# Patient Record
Sex: Female | Born: 1975 | Race: White | Hispanic: No | Marital: Married | Smoking: Never smoker
Health system: Southern US, Community
[De-identification: ages and names within clinical notes are randomized; demographics above are authoritative.]

## PROBLEM LIST (undated history)

## (undated) DIAGNOSIS — M419 Scoliosis, unspecified: Secondary | ICD-10-CM

## (undated) DIAGNOSIS — K219 Gastro-esophageal reflux disease without esophagitis: Secondary | ICD-10-CM

## (undated) HISTORY — PX: ESOPHAGOGASTRODUODENOSCOPY: SHX1529

## (undated) HISTORY — PX: DIAGNOSTIC LAPAROSCOPY: SUR761

---

## 2007-05-07 ENCOUNTER — Ambulatory Visit: Payer: Self-pay | Admitting: Gynecology

## 2007-05-08 ENCOUNTER — Inpatient Hospital Stay (HOSPITAL_COMMUNITY): Admission: AD | Admit: 2007-05-08 | Discharge: 2007-05-08 | Payer: Self-pay | Admitting: Obstetrics & Gynecology

## 2008-03-31 ENCOUNTER — Ambulatory Visit: Payer: Self-pay

## 2008-04-01 ENCOUNTER — Inpatient Hospital Stay: Payer: Self-pay

## 2009-04-15 ENCOUNTER — Ambulatory Visit: Payer: Self-pay | Admitting: Unknown Physician Specialty

## 2009-04-21 ENCOUNTER — Ambulatory Visit: Payer: Self-pay | Admitting: Unknown Physician Specialty

## 2010-11-22 LAB — URINALYSIS, ROUTINE W REFLEX MICROSCOPIC
Bilirubin Urine: NEGATIVE
Glucose, UA: NEGATIVE
Specific Gravity, Urine: <1.005 — ABNORMAL LOW
Urobilinogen, UA: 0.2
pH: 6

## 2010-11-22 LAB — POCT PREGNANCY, URINE: Operator id: 113551

## 2010-11-22 LAB — URINE MICROSCOPIC-ADD ON

## 2010-11-22 LAB — HCG, SERUM, QUALITATIVE: Preg, Serum: NEGATIVE

## 2011-08-20 ENCOUNTER — Emergency Department: Payer: Self-pay | Admitting: Internal Medicine

## 2011-08-20 LAB — URINALYSIS, COMPLETE: Bilirubin,UR: NEGATIVE

## 2011-08-20 LAB — COMPREHENSIVE METABOLIC PANEL
Albumin: 4 g/dL (ref 3.4–5.0)
Anion Gap: 2 — ABNORMAL LOW (ref 7–16)
Bilirubin,Total: 0.4 mg/dL (ref 0.2–1.0)
Calcium, Total: 9.7 mg/dL (ref 8.5–10.1)
Chloride: 103 mmol/L (ref 98–107)
EGFR (Non-African Amer.): >60
Potassium: 4.3 mmol/L (ref 3.5–5.1)
SGPT (ALT): 20 U/L
Sodium: 135 mmol/L — ABNORMAL LOW (ref 136–145)

## 2011-08-20 LAB — CBC
MCV: 93 fL (ref 80–100)
RBC: 4.82 10*6/uL (ref 3.80–5.20)
RDW: 12.7 % (ref 11.5–14.5)

## 2014-02-18 ENCOUNTER — Ambulatory Visit: Payer: Self-pay | Admitting: Family Medicine

## 2014-10-28 DIAGNOSIS — R0789 Other chest pain: Secondary | ICD-10-CM | POA: Insufficient documentation

## 2014-10-28 DIAGNOSIS — Z01818 Encounter for other preprocedural examination: Secondary | ICD-10-CM | POA: Insufficient documentation

## 2015-12-03 ENCOUNTER — Other Ambulatory Visit: Payer: Self-pay | Admitting: Student

## 2015-12-03 DIAGNOSIS — R0789 Other chest pain: Secondary | ICD-10-CM

## 2015-12-29 ENCOUNTER — Ambulatory Visit
Admission: RE | Admit: 2015-12-29 | Discharge: 2015-12-29 | Disposition: A | Discharge status: Home / Self Care | Payer: BLUE CROSS/BLUE SHIELD | Source: Ambulatory Visit | Attending: Student | Admitting: Student

## 2015-12-29 DIAGNOSIS — R0789 Other chest pain: Secondary | ICD-10-CM | POA: Diagnosis not present

## 2015-12-29 MED ORDER — TECHNETIUM TC 99M MEBROFENIN IV KIT
5.1900 | PACK | Freq: Once | INTRAVENOUS | Status: AC | PRN
  Administered 2015-12-29: 5.19 via INTRAVENOUS

## 2016-02-09 ENCOUNTER — Encounter: Payer: Self-pay | Admitting: *Deleted

## 2016-02-09 ENCOUNTER — Encounter
Admission: RE | Admit: 2016-02-09 | Discharge: 2016-02-09 | Disposition: A | Discharge status: Home / Self Care | Payer: BLUE CROSS/BLUE SHIELD | Source: Ambulatory Visit | Attending: Surgery | Admitting: Surgery

## 2016-02-09 HISTORY — DX: Gastro-esophageal reflux disease without esophagitis: K21.9

## 2016-02-09 HISTORY — DX: Scoliosis, unspecified: M41.9

## 2016-02-09 NOTE — Pre-Procedure Instructions (Signed)
ECG 12-[facility]8/17/2016 [facility] Component Name Value Ref Range  Vent Rate (bpm) 59   PR Interval (msec) 138   QRS Interval (msec) 84   QT Interval (msec) 398   QTc (msec) 394   Result Narrative  Sinus bradycardia with sinus arrhythmia Otherwise normal ECG When compared with ECG of 30-Jul-2013 08:21, No significant change was found I reviewed and concur with this report. Electronically signed UJ:WJXBby:FATH MD, KEN [phone]([id number]) on 10/25/2014 9:05:10 AM  Status Results Details    [facility]")' [email or web address]/">Encounter Summary

## 2016-02-09 NOTE — Patient Instructions (Signed)
  Your procedure is scheduled on: 02-12-16 Report to Same Day Surgery 2nd floor medical mall [facility](Medical Mall Entrance-take elevator on left to 2nd floor.  Check in with surgery information desk.) To find out your arrival time please call [phone] between 1PM - 3PM on 02-11-16  Remember: Instructions that are not followed completely may result in serious medical risk, up to and including death, or upon the discretion of your surgeon and anesthesiologist your surgery may need to be rescheduled.    _x___ 1. Do not eat food or drink liquids after midnight. No gum chewing or hard candies.     __x__ 2. No Alcohol for 24 hours before or after surgery.   __x__3. No Smoking for 24 prior to surgery.   ____  4. Bring all medications with you on the day of surgery if instructed.    __x__ 5. Notify your doctor if there is any change in your medical condition     (cold, fever, infections).     Do not wear jewelry, make-up, hairpins, clips or nail polish.  Do not wear lotions, powders, or perfumes. You may wear deodorant.  Do not shave 48 hours prior to surgery. Men may shave face and neck.  Do not bring valuables to the hospital.    [facility] not [facility] for any belongings or valuables.               Contacts, dentures or bridgework may not be worn into surgery.  Leave your suitcase in the car. After surgery it may be brought to your room.  For patients admitted to the hospital, discharge time is determined by your treatment team.   Patients discharged the day of surgery will not be allowed to drive home.  You will need someone to drive you home and stay with you the night of your procedure.    Please read over the following fact sheets that you were given:   [facility] Pre[facility] and or MRSA Information   _x___ Take these medicines the morning of surgery with A SIP OF WATER:    1. ZANTAC  2. TAKE A ZANTAC THE NIGHT BEFORE SURGERY BEFORE  BED  3.  4.  5.  6.  ____Fleets enema or Magnesium Citrate as directed.   ____ Use CHG Soap or sage wipes as directed on instruction sheet   ____ Use inhalers on the day of surgery and bring to hospital day of surgery  ____ Stop metformin 2 days prior to surgery    ____ Take 1/2 of usual insulin dose the night before surgery and none on the morning of           surgery.   ____ Stop Aspirin, Coumadin, Pllavix ,Eliquis, Effient, or Pradaxa  x__ Stop Anti-inflammatories such as Advil, Aleve, Ibuprofen, Motrin, Naproxen,          Naprosyn, Goodies powders or aspirin products NOW-Ok to take Tylenol.   ____ Stop supplements until after surgery.    ____ Bring C-Pap to the hospital.

## 2016-02-12 ENCOUNTER — Ambulatory Visit: Payer: BLUE CROSS/BLUE SHIELD | Admitting: Anesthesiology

## 2016-02-12 ENCOUNTER — Ambulatory Visit
Admission: RE | Admit: 2016-02-12 | Discharge: 2016-02-12 | Disposition: A | Discharge status: Home / Self Care | Payer: BLUE CROSS/BLUE SHIELD | Source: Ambulatory Visit | Attending: Surgery | Admitting: Surgery

## 2016-02-12 ENCOUNTER — Encounter
Admission: RE | Disposition: A | Discharge status: Home / Self Care | Payer: Self-pay | Source: Ambulatory Visit | Attending: Surgery

## 2016-02-12 ENCOUNTER — Ambulatory Visit: Payer: BLUE CROSS/BLUE SHIELD

## 2016-02-12 ENCOUNTER — Encounter: Payer: Self-pay | Admitting: *Deleted

## 2016-02-12 DIAGNOSIS — K811 Chronic cholecystitis: Secondary | ICD-10-CM | POA: Diagnosis not present

## 2016-02-12 DIAGNOSIS — K219 Gastro-esophageal reflux disease without esophagitis: Secondary | ICD-10-CM | POA: Diagnosis not present

## 2016-02-12 DIAGNOSIS — Z419 Encounter for procedure for purposes other than remedying health state, unspecified: Secondary | ICD-10-CM

## 2016-02-12 HISTORY — PX: CHOLECYSTECTOMY: SHX55

## 2016-02-12 LAB — POCT PREGNANCY, URINE: Preg Test, Ur: NEGATIVE

## 2016-02-12 SURGERY — LAPAROSCOPIC CHOLECYSTECTOMY WITH INTRAOPERATIVE CHOLANGIOGRAM
Anesthesia: General | Site: Abdomen | Wound class: Clean Contaminated

## 2016-02-12 MED ORDER — SODIUM CHLORIDE 0.9 % IV SOLN
INTRAVENOUS | Status: DC | PRN
  Administered 2016-02-12: 15 mL

## 2016-02-12 MED ORDER — SUGAMMADEX SODIUM 200 MG/2ML IV SOLN
INTRAVENOUS | Status: DC | PRN
  Administered 2016-02-12: 120 mg via INTRAVENOUS

## 2016-02-12 MED ORDER — FENTANYL CITRATE (PF) 100 MCG/2ML IJ SOLN
25.0000 ug | INTRAMUSCULAR | Status: DC | PRN
  Administered 2016-02-12 (×3): 50 ug via INTRAVENOUS

## 2016-02-12 MED ORDER — HYDROCODONE-ACETAMINOPHEN 5-325 MG PO TABS: 1.0000 | ORAL_TABLET | ORAL | 0 refills | Status: DC | PRN

## 2016-02-12 MED ORDER — ONDANSETRON HCL 4 MG/2ML IJ SOLN
INTRAMUSCULAR | Status: DC | PRN
  Administered 2016-02-12: 4 mg via INTRAVENOUS

## 2016-02-12 MED ORDER — LIDOCAINE HCL (CARDIAC) 20 MG/ML IV SOLN
INTRAVENOUS | Status: DC | PRN
  Administered 2016-02-12: 60 mg via INTRAVENOUS

## 2016-02-12 MED ORDER — HYDROCODONE-ACETAMINOPHEN 5-325 MG PO TABS
1.0000 | ORAL_TABLET | ORAL | Status: DC | PRN
  Administered 2016-02-12: 1 via ORAL

## 2016-02-12 MED ORDER — MIDAZOLAM HCL 2 MG/2ML IJ SOLN
INTRAMUSCULAR | Status: DC | PRN
  Administered 2016-02-12: 2 mg via INTRAVENOUS

## 2016-02-12 MED ORDER — FENTANYL CITRATE (PF) 100 MCG/2ML IJ SOLN
INTRAMUSCULAR | Status: AC
  Filled 2016-02-12: qty 2

## 2016-02-12 MED ORDER — ROCURONIUM BROMIDE 100 MG/10ML IV SOLN
INTRAVENOUS | Status: DC | PRN
  Administered 2016-02-12: 35 mg via INTRAVENOUS
  Administered 2016-02-12: 10 mg via INTRAVENOUS

## 2016-02-12 MED ORDER — FENTANYL CITRATE (PF) 100 MCG/2ML IJ SOLN
INTRAMUSCULAR | Status: DC | PRN
  Administered 2016-02-12 (×2): 50 ug via INTRAVENOUS

## 2016-02-12 MED ORDER — HEPARIN SODIUM (PORCINE) 5000 UNIT/ML IJ SOLN
INTRAMUSCULAR | Status: AC
  Filled 2016-02-12: qty 1

## 2016-02-12 MED ORDER — KETOROLAC TROMETHAMINE 30 MG/ML IJ SOLN
INTRAMUSCULAR | Status: AC
  Administered 2016-02-12: 30 mg via INTRAVENOUS
  Filled 2016-02-12: qty 1

## 2016-02-12 MED ORDER — KETOROLAC TROMETHAMINE 30 MG/ML IJ SOLN
30.0000 mg | Freq: Once | INTRAMUSCULAR | Status: AC
  Administered 2016-02-12: 30 mg via INTRAVENOUS

## 2016-02-12 MED ORDER — OXYCODONE HCL 5 MG PO TABS: 5.0000 mg | ORAL_TABLET | Freq: Once | ORAL | Status: DC | PRN

## 2016-02-12 MED ORDER — LACTATED RINGERS IV SOLN
INTRAVENOUS | Status: DC
  Administered 2016-02-12 (×2): via INTRAVENOUS

## 2016-02-12 MED ORDER — HYDROCODONE-ACETAMINOPHEN 5-325 MG PO TABS
ORAL_TABLET | ORAL | Status: AC
  Filled 2016-02-12: qty 1

## 2016-02-12 MED ORDER — OXYCODONE HCL 5 MG/5ML PO SOLN: 5.0000 mg | Freq: Once | ORAL | Status: DC | PRN

## 2016-02-12 MED ORDER — SODIUM CHLORIDE 0.9 % IV SOLN
INTRAVENOUS | Status: DC | PRN
  Administered 2016-02-12: 16:00:00 150 mL via INTRAMUSCULAR

## 2016-02-12 MED ORDER — PROPOFOL 10 MG/ML IV BOLUS
INTRAVENOUS | Status: DC | PRN
Start: 2016-02-12 — End: 2016-02-12
  Administered 2016-02-12: 40 mg via INTRAVENOUS
  Administered 2016-02-12: 150 mg via INTRAVENOUS

## 2016-02-12 MED ORDER — DEXAMETHASONE SODIUM PHOSPHATE 10 MG/ML IJ SOLN
INTRAMUSCULAR | Status: DC | PRN
  Administered 2016-02-12: 5 mg via INTRAVENOUS

## 2016-02-12 SURGICAL SUPPLY — 37 items
APPLIER CLIP ROT 10 11.4 M/L (STAPLE) ×3
CANISTER SUCT 1200ML W/VALVE (MISCELLANEOUS) ×3 IMPLANT
CANNULA DILATOR 10 W/SLV (CANNULA) ×2 IMPLANT
CANNULA DILATOR 10MM W/SLV (CANNULA) ×1
CATH REDDICK CHOLANGI 4FR 50CM (CATHETERS) ×3 IMPLANT
CHLORAPREP W/TINT 26ML (MISCELLANEOUS) ×3 IMPLANT
CLIP APPLIE ROT 10 11.4 M/L (STAPLE) ×1 IMPLANT
CLOSURE WOUND 1/2 X4 (GAUZE/BANDAGES/DRESSINGS) ×1
DRAPE SHEET LG 3/4 BI-LAMINATE (DRAPES) ×3 IMPLANT
ELECT REM PT RETURN 9FT ADLT (ELECTROSURGICAL) ×3
ELECTRODE REM PT RTRN 9FT ADLT (ELECTROSURGICAL) ×1 IMPLANT
GAUZE SPONGE 4X4 12PLY STRL (GAUZE/BANDAGES/DRESSINGS) ×3 IMPLANT
GLOVE BIO SURGEON STRL SZ7.5 (GLOVE) ×3 IMPLANT
GOWN STRL REUS W/ TWL LRG LVL3 (GOWN DISPOSABLE) ×4 IMPLANT
GOWN STRL REUS W/TWL LRG LVL3 (GOWN DISPOSABLE) ×8
IRRIGATION STRYKERFLOW (MISCELLANEOUS) ×1 IMPLANT
IRRIGATOR STRYKERFLOW (MISCELLANEOUS) ×3
IV NS 1000ML (IV SOLUTION) ×2
IV NS 1000ML BAXH (IV SOLUTION) ×1 IMPLANT
KIT RM TURNOVER STRD PROC AR (KITS) ×3 IMPLANT
LABEL OR SOLS (LABEL) ×3 IMPLANT
NDL INSUFF ACCESS 14 VERSASTEP (NEEDLE) ×3 IMPLANT
NEEDLE FILTER BLUNT 18X 1/2SAF (NEEDLE) ×2
NEEDLE FILTER BLUNT 18X1 1/2 (NEEDLE) ×1 IMPLANT
NS IRRIG 500ML POUR BTL (IV SOLUTION) ×3 IMPLANT
PACK LAP CHOLECYSTECTOMY (MISCELLANEOUS) ×3 IMPLANT
SCISSORS METZENBAUM CVD 33 (INSTRUMENTS) ×3 IMPLANT
SEAL FOR SCOPE WARMER C3101 (MISCELLANEOUS) IMPLANT
SLEEVE ENDOPATH XCEL 5M (ENDOMECHANICALS) ×3 IMPLANT
STRIP CLOSURE SKIN 1/2X4 (GAUZE/BANDAGES/DRESSINGS) ×2 IMPLANT
SUT CHROMIC 5 0 RB 1 27 (SUTURE) ×3 IMPLANT
SUT VIC AB 0 CT2 27 (SUTURE) IMPLANT
SYR 3ML LL SCALE MARK (SYRINGE) ×3 IMPLANT
TROCAR XCEL NON-BLD 11X100MML (ENDOMECHANICALS) ×3 IMPLANT
TROCAR XCEL NON-BLD 5MMX100MML (ENDOMECHANICALS) ×3 IMPLANT
TUBING INSUFFLATOR HI FLOW (MISCELLANEOUS) ×3 IMPLANT
WATER STERILE IRR 1000ML POUR (IV SOLUTION) ×3 IMPLANT

## 2016-02-12 NOTE — Anesthesia Procedure Notes (Signed)
Procedure Name: Intubation Performed by: Lily KocherPERALTA, Satsuki Zillmer Pre-anesthesia Checklist: Patient identified, Patient being monitored, Timeout performed, Emergency Drugs available and Suction available Patient Re-evaluated:Patient Re-evaluated prior to inductionOxygen Delivery Method: Circle system utilized Preoxygenation: Pre-oxygenation with 100% oxygen Intubation Type: IV induction Ventilation: Mask ventilation without difficulty Laryngoscope Size: Mac and 3 Grade View: Grade I Tube type: Oral Tube size: 7.0 mm Number of attempts: 1 Airway Equipment and Method: Stylet Placement Confirmation: ETT inserted through vocal cords under direct vision,  positive ETCO2 and breath sounds checked- equal and bilateral Secured at: 21 cm Tube secured with: Tape Dental Injury: Teeth and Oropharynx as per pre-operative assessment

## 2016-02-12 NOTE — Op Note (Signed)
OPERATIVE REPORT  PREOPERATIVE DIAGNOSIS:  Chronic cholecystitis   POSTOPERATIVE DIAGNOSIS: Chronic cholecystitis   PROCEDURE: Laparoscopic cholecystectomy with cholangiogram  ANESTHESIA: General  SURGEON: Renda RollsWilton Smith M.D.  INDICATIONS: She has history of epigastric pains and abnormally low gallbladder ejection fraction. Surgery was recommended for definitive treatment.  With the patient on the operating table in the supine position under general endotracheal anesthesia the abdomen was prepared with ChloraPrep solution and draped in a sterile manner. A short incision was made in the inferior aspect of the umbilicus and carried down to the deep fascia which was grasped with a laryngeal hook. A Veress needle was inserted aspirated and irrigated with a saline solution. The peritoneal cavity was insufflated with carbon dioxide. The Veress needle was removed. The 10 mm cannula was inserted. The 10 mm 0 laparoscope was inserted to view the peritoneal cavity.  Another incision was made in the epigastrium slightly to the right of the midline to introduce an 11 mm cannula. 2 incisions were made in the lateral aspect of the right upper quadrant to introduce 2   5 mm cannulas. Initial inspection revealed typical appearance of the liver or stomach and visible intestines.  The gallbladder was retracted towards the right shoulder. There was mild thickening of the gallbladder wall. The gallbladder neck was retracted inferiorly and laterally.  The porta hepatis was identified. The gallbladder was mobilized with incision of the visceral peritoneum. The cystic duct was dissected free from surrounding structures. The cystic artery was dissected free from surrounding structures. A critical view of safety was demonstrated  An Endo Clip was placed across the cystic duct adjacent to the gallbladder neck. An incision was made in the cystic duct to introduce a Reddick catheter. The cholangiogram was done with injection of  half-strength Conray 60 dye. This demonstrated the bile ducts and flow of dye into the duodenum. No retained stones were identified. There was mild dilatation of the common hepatic duct and common bile duct.. The Reddick catheter was removed. The cystic duct was doubly ligated with endoclips and divided. The cystic artery was controlled with  endoclips and divided. The gallbladder was dissected free from the liver with use of hook and cautery and blunt dissection. Bleeding was minimal and hemostasis was intact. The gallbladder was delivered up through the infraumbilical incision opened and suctioned.  The bile appeared to be thickened. There were no palpable stones. The gallbladder was submitted in formalin for routine pathology. The skin incisions were closed with interrupted 5-0 chromic subcutaneous suture benzoin and Steri-Strips. Gauze dressings were applied with paper tape.  The patient appeared to be in satisfactory condition and was prepared for transfer to the recovery room  Renda RollsWilton Smith M.D.

## 2016-02-12 NOTE — Anesthesia Postprocedure Evaluation (Signed)
Anesthesia Post Note  Patient: Elmer PickerMelanie D Mcneill  Procedure(s) Performed: Procedure(s) (LRB): LAPAROSCOPIC CHOLECYSTECTOMY WITH INTRAOPERATIVE CHOLANGIOGRAM (N/A)  Patient location during evaluation: PACU Anesthesia Type: General Level of consciousness: awake and alert Pain management: pain level controlled Vital Signs Assessment: post-procedure vital signs reviewed and stable Respiratory status: spontaneous breathing, nonlabored ventilation, respiratory function stable and patient connected to nasal cannula oxygen Cardiovascular status: blood pressure returned to baseline and stable Postop Assessment: no signs of nausea or vomiting Anesthetic complications: no    Last Vitals:  Vitals:   02/12/16 1746 02/12/16 1820  BP: 130/69 134/74  Pulse: (!) 51 (!) 56  Resp: 16 16  Temp: 37.3 C 36.8 C    Last Pain:  Vitals:   02/12/16 1820  TempSrc:   PainSc: 5                  Malkia Nippert S

## 2016-02-12 NOTE — OR Nursing (Signed)
Patient desires discharge at this time.  Patient complained of pain in right shoulder dr w Katrinka Blazingsmith in to see patient no new orders at this time.

## 2016-02-12 NOTE — Discharge Instructions (Addendum)
Take Tylenol or Norco if needed for pain.  Should not drive or do anything dangerous when taking Norco.  Remove dressings on Saturday. May shower on Sunday.   AMBULATORY SURGERY  DISCHARGE INSTRUCTIONS   1) The drugs that you were given will stay in your system until tomorrow so for the next 24 hours you should not:  A) Drive an automobile B) Make any legal decisions C) Drink any alcoholic beverage   2) You may resume regular meals tomorrow.  Today it is better to start with liquids and gradually work up to solid foods.  You may eat anything you prefer, but it is better to start with liquids, then soup and crackers, and gradually work up to solid foods.   3) Please notify your doctor immediately if you have any unusual bleeding, trouble breathing, redness and pain at the surgery site, drainage, fever, or pain not relieved by medication.    4) Additional Instructions:        Please contact your physician with any problems or Same Day Surgery at [phone], Monday through Friday 6 am to 4 pm, or [place] at [facility] Main number at [phone].

## 2016-02-12 NOTE — H&P (Signed)
  She reports no change in condition since the day of the office exam.  Lab work was noted.  I discussed the plan for laparoscopic cholecystectomy

## 2016-02-12 NOTE — Anesthesia Preprocedure Evaluation (Signed)
Anesthesia Evaluation  Patient identified by MRN, date of birth, ID band Patient awake    Reviewed: Allergy & Precautions, H&P , NPO status , Patient's Chart, lab work & pertinent test results  History of Anesthesia Complications Negative for: history of anesthetic complications  Airway Mallampati: I  TM Distance: >3 FB Neck ROM: full    Dental no notable dental hx. (+) Teeth Intact   Pulmonary neg pulmonary ROS, neg shortness of breath,    Pulmonary exam normal breath sounds clear to auscultation       Cardiovascular Exercise Tolerance: Good (-) angina(-) Past MI and (-) DOE negative cardio ROS Normal cardiovascular exam Rhythm:regular Rate:Normal     Neuro/Psych negative neurological ROS  negative psych ROS   GI/Hepatic Neg liver ROS, GERD  Medicated and Controlled,  Endo/Other  negative endocrine ROS  Renal/GU      Musculoskeletal   Abdominal   Peds  Hematology negative hematology ROS (+)   Anesthesia Other Findings Past Medical History: No date: GERD (gastroesophageal reflux disease) No date: Scoliosis  Past Surgical History: No date: CESAREAN SECTION No date: DIAGNOSTIC LAPAROSCOPY No date: ESOPHAGOGASTRODUODENOSCOPY  BMI    Body Mass Index:  22.85 kg/m      Reproductive/Obstetrics negative OB ROS                             Anesthesia Physical Anesthesia Plan  ASA: III  Anesthesia Plan: General ETT   Post-op Pain Management:    Induction:   Airway Management Planned:   Additional Equipment:   Intra-op Plan:   Post-operative Plan:   Informed Consent: I have reviewed the patients History and Physical, chart, labs and discussed the procedure including the risks, benefits and alternatives for the proposed anesthesia with the patient or authorized representative who has indicated his/her understanding and acceptance.   Dental Advisory Given  Plan Discussed  with: Anesthesiologist, CRNA and Surgeon  Anesthesia Plan Comments:         Anesthesia Quick Evaluation

## 2016-02-12 NOTE — Transfer of Care (Signed)
Immediate Anesthesia Transfer of Care Note  Patient: Peggy Cooper  Procedure(s) Performed: Procedure(s): LAPAROSCOPIC CHOLECYSTECTOMY WITH INTRAOPERATIVE CHOLANGIOGRAM (N/A)  Patient Location: PACU  Anesthesia Type:General  Level of Consciousness: sedated  Airway & Oxygen Therapy: Patient Spontanous Breathing and Patient connected to face mask oxygen  Post-op Assessment: Report given to RN and Post -op Vital signs reviewed and stable  Post vital signs: Reviewed and stable  Last Vitals:  Vitals:   02/12/16 1400 02/12/16 1655  BP:  132/78  Pulse: 64 86  Resp: 16 (!) 22  Temp: 36.8 C 36.4 C    Last Pain:  Vitals:   02/12/16 1400  TempSrc: Oral         Complications: No apparent anesthesia complications

## 2016-02-15 ENCOUNTER — Encounter: Payer: Self-pay | Admitting: Surgery

## 2016-02-16 LAB — SURGICAL PATHOLOGY

## 2017-07-13 ENCOUNTER — Other Ambulatory Visit: Payer: Self-pay | Admitting: Obstetrics and Gynecology

## 2017-07-13 DIAGNOSIS — Z1231 Encounter for screening mammogram for malignant neoplasm of breast: Secondary | ICD-10-CM

## 2017-07-18 ENCOUNTER — Encounter (INDEPENDENT_AMBULATORY_CARE_PROVIDER_SITE_OTHER): Payer: Self-pay

## 2017-07-18 ENCOUNTER — Ambulatory Visit
Admission: RE | Admit: 2017-07-18 | Discharge: 2017-07-18 | Disposition: A | Discharge status: Home / Self Care | Payer: BLUE CROSS/BLUE SHIELD | Source: Ambulatory Visit | Attending: Obstetrics and Gynecology | Admitting: Obstetrics and Gynecology

## 2017-07-18 DIAGNOSIS — Z1231 Encounter for screening mammogram for malignant neoplasm of breast: Secondary | ICD-10-CM | POA: Insufficient documentation

## 2017-07-19 ENCOUNTER — Other Ambulatory Visit: Payer: Self-pay | Admitting: *Deleted

## 2017-07-19 ENCOUNTER — Inpatient Hospital Stay
Admission: RE | Admit: 2017-07-19 | Discharge: 2017-07-19 | Disposition: A | Discharge status: Home / Self Care | Payer: Self-pay | Source: Ambulatory Visit | Attending: *Deleted | Admitting: *Deleted

## 2017-07-19 DIAGNOSIS — Z9289 Personal history of other medical treatment: Secondary | ICD-10-CM

## 2017-10-12 DIAGNOSIS — R55 Syncope and collapse: Secondary | ICD-10-CM | POA: Insufficient documentation

## 2018-08-05 ENCOUNTER — Other Ambulatory Visit: Payer: Self-pay

## 2018-08-05 ENCOUNTER — Encounter: Payer: Self-pay | Admitting: *Deleted

## 2018-08-05 ENCOUNTER — Emergency Department
Admission: EM | Admit: 2018-08-05 | Discharge: 2018-08-05 | Disposition: A | Discharge status: Home / Self Care | Payer: BC Managed Care – PPO | Attending: Emergency Medicine | Admitting: Emergency Medicine

## 2018-08-05 ENCOUNTER — Emergency Department: Payer: BC Managed Care – PPO

## 2018-08-05 DIAGNOSIS — R51 Headache: Secondary | ICD-10-CM | POA: Diagnosis present

## 2018-08-05 DIAGNOSIS — U071 COVID-19: Secondary | ICD-10-CM | POA: Diagnosis not present

## 2018-08-05 LAB — CBC WITH DIFFERENTIAL/PLATELET
Abs Immature Granulocytes: 0.01 10*3/uL (ref 0.00–0.07)
Basophils Absolute: 0 10*3/uL (ref 0.0–0.1)
Basophils Relative: 1 %
Eosinophils Absolute: 0.3 10*3/uL (ref 0.0–0.5)
Eosinophils Relative: 8 %
HCT: 44 % (ref 36.0–46.0)
Hemoglobin: 14.6 g/dL (ref 12.0–15.0)
Immature Granulocytes: 0 %
Lymphocytes Relative: 25 %
Lymphs Abs: 1 10*3/uL (ref 0.7–4.0)
MCH: 30.5 pg (ref 26.0–34.0)
MCHC: 33.2 g/dL (ref 30.0–36.0)
MCV: 91.9 fL (ref 80.0–100.0)
Monocytes Absolute: 0.7 10*3/uL (ref 0.1–1.0)
Monocytes Relative: 18 %
Neutro Abs: 1.8 10*3/uL (ref 1.7–7.7)
Neutrophils Relative %: 48 %
Platelets: 199 10*3/uL (ref 150–400)
RBC: 4.79 MIL/uL (ref 3.87–5.11)
RDW: 12.2 % (ref 11.5–15.5)
WBC: 3.8 10*3/uL — ABNORMAL LOW (ref 4.0–10.5)
nRBC: 0 % (ref 0.0–0.2)

## 2018-08-05 LAB — COMPREHENSIVE METABOLIC PANEL
ALT: 16 U/L (ref 0–44)
AST: 20 U/L (ref 15–41)
Albumin: 4.2 g/dL (ref 3.5–5.0)
Alkaline Phosphatase: 63 U/L (ref 38–126)
Anion gap: 8 (ref 5–15)
BUN: 16 mg/dL (ref 6–20)
CO2: 25 mmol/L (ref 22–32)
Calcium: 9.1 mg/dL (ref 8.9–10.3)
Chloride: 105 mmol/L (ref 98–111)
Creatinine, Ser: 0.71 mg/dL (ref 0.44–1.00)
GFR calc Af Amer: >60 mL/min (ref >60)
GFR calc non Af Amer: >60 mL/min (ref >60)
Glucose, Bld: 85 mg/dL (ref 70–99)
Potassium: 4.1 mmol/L (ref 3.5–5.1)
Sodium: 138 mmol/L (ref 135–145)
Total Bilirubin: 0.3 mg/dL (ref 0.3–1.2)
Total Protein: 7.3 g/dL (ref 6.5–8.1)

## 2018-08-05 LAB — SARS CORONAVIRUS 2 BY RT PCR (HOSPITAL ORDER, PERFORMED IN ~~LOC~~ HOSPITAL LAB): SARS Coronavirus 2: POSITIVE — AB

## 2018-08-05 MED ORDER — METOCLOPRAMIDE HCL 5 MG/ML IJ SOLN
10.0000 mg | Freq: Once | INTRAMUSCULAR | Status: AC
  Administered 2018-08-05: 10 mg via INTRAVENOUS
  Filled 2018-08-05: qty 2

## 2018-08-05 MED ORDER — KETOROLAC TROMETHAMINE 30 MG/ML IJ SOLN
30.0000 mg | Freq: Once | INTRAMUSCULAR | Status: AC
  Administered 2018-08-05: 30 mg via INTRAVENOUS
  Filled 2018-08-05: qty 1

## 2018-08-05 MED ORDER — HYDROCOD POLST-CPM POLST ER 10-8 MG/5ML PO SUER: 5.0000 mL | Freq: Two times a day (BID) | ORAL | 0 refills | Status: DC

## 2018-08-05 MED ORDER — SODIUM CHLORIDE 0.9 % IV SOLN
Freq: Once | INTRAVENOUS | Status: AC
  Administered 2018-08-05: 13:00:00 via INTRAVENOUS

## 2018-08-05 NOTE — ED Triage Notes (Signed)
Pt reporting severe headache, sore throat, fever and chills with a cough since yesterday. Pt taking tylenol at home that is not helping headache. Pt reports a coworker was positive for COVID last week.

## 2018-08-05 NOTE — ED Notes (Signed)
Date and time results received: 08/05/18 2:19 PM   Test: CoVid Critical Value: Pos  Name of Provider Notified: Jimmye Norman

## 2018-08-05 NOTE — ED Provider Notes (Signed)
[facility] Emergency Department Provider Note       Time seen: ----------------------------------------- 1:19 PM on 08/05/2018 -----------------------------------------   I have reviewed the triage vital signs and the nursing notes.  HISTORY   Chief Complaint Headache and Sore Throat   HPI Peggy Cooper is a 43 y.o. female with a history of GERD, scoliosis who presents to the ED for severe headache, sore throat, fever and chills with cough since yesterday.  Patient has been taking Tylenol at home without improvement.  Coworker tested positive for COVID last week.  She just completed a week on vacation.  Past Medical History:  Diagnosis Date  . GERD (gastroesophageal reflux disease)   . Scoliosis     There are no active problems to display for this patient.   Past Surgical History:  Procedure Laterality Date  . CESAREAN SECTION    . CHOLECYSTECTOMY N/A 02/12/2016   Procedure: LAPAROSCOPIC CHOLECYSTECTOMY WITH INTRAOPERATIVE CHOLANGIOGRAM;  Surgeon: Nadeen LandauJarvis Wilton Smith, MD;  Location: [facility] ORS;  Service: General;  Laterality: N/A;  . DIAGNOSTIC LAPAROSCOPY    . ESOPHAGOGASTRODUODENOSCOPY      Allergies Patient has no known allergies.  Social History Social History   Tobacco Use  . Smoking status: Never Smoker  . Smokeless tobacco: Never Used  Substance Use Topics  . Alcohol use: Yes    Comment: OCC WINE   . Drug use: No   Review of Systems Constitutional: Positive for fevers, chills, body aches Cardiovascular: Negative for chest pain. Respiratory: Negative for shortness of breath.  Positive for cough Gastrointestinal: Negative for abdominal pain, vomiting and diarrhea. Musculoskeletal: Negative for back pain.  Positive for myalgias Skin: Negative for rash. Neurological: Positive for headache  All systems negative/normal/unremarkable except as stated in the HPI  ____________________________________________   PHYSICAL  EXAM:  VITAL SIGNS: ED Triage Vitals  Enc Vitals Group     BP 08/05/18 1216 (!) 109/92     Pulse Rate 08/05/18 1216 76     Resp 08/05/18 1216 17     Temp 08/05/18 1216 98.2 F (36.8 C)     Temp Source 08/05/18 1216 Oral     SpO2 08/05/18 1216 100 %     Weight 08/05/18 1215 145 lb (65.8 kg)     Height 08/05/18 1215 5\' 3"  (1.6 m)     Head Circumference --      Peak Flow --      Pain Score 08/05/18 1215 6     Pain Loc --      Pain Edu? --      Excl. in GC? --    Constitutional: Alert and oriented. Well appearing and in no distress. Eyes: Conjunctivae are normal. Normal extraocular movements. ENT      Head: Normocephalic and atraumatic.      Nose: No congestion/rhinnorhea.      Mouth/Throat: Mucous membranes are moist.      Neck: No stridor. Cardiovascular: Normal rate, regular rhythm. No murmurs, rubs, or gallops. Respiratory: Normal respiratory effort without tachypnea nor retractions. Breath sounds are clear and equal bilaterally. No wheezes/rales/rhonchi. Gastrointestinal: Soft and nontender. Normal bowel sounds Musculoskeletal: Nontender with normal range of motion in extremities. No lower extremity tenderness nor edema. Neurologic:  Normal speech and language. No gross focal neurologic deficits are appreciated.  Skin:  Skin is warm, dry and intact. No rash noted. Psychiatric: Mood and affect are normal. Speech and behavior are normal.  ____________________________________________  ED COURSE:  As part of my medical decision  making, I reviewed the following data within the [place] History obtained from family if available, nursing notes, old chart and ekg, as well as notes from prior ED visits. Patient presented for viral symptoms, we will assess with labs and imaging as indicated at this time.   Procedures  BILLI BRIGHT was evaluated in Emergency Department on 08/05/2018 for the symptoms described in the history of present illness. She was evaluated  in the context of the global COVID-19 pandemic, which necessitated consideration that the patient might be at risk for infection with the SARS-CoV-2 virus that causes COVID-19. Institutional protocols and algorithms that pertain to the evaluation of patients at risk for COVID-19 are in a state of rapid change based on information released by regulatory bodies including the CDC and federal and state organizations. These policies and algorithms were followed during the patient's care in the ED.  ____________________________________________   LABS (pertinent positives/negatives)  Labs Reviewed  SARS CORONAVIRUS 2 (HOSPITAL ORDER, [facility]) - Abnormal; Notable for the following components:      Result Value   SARS Coronavirus 2 POSITIVE (*)    All other components within normal limits  CBC WITH DIFFERENTIAL/PLATELET - Abnormal; Notable for the following components:   WBC 3.8 (*)    All other components within normal limits  COMPREHENSIVE METABOLIC PANEL    RADIOLOGY Images were viewed by me  Chest x-ray  IMPRESSION: No acute cardiopulmonary disease. ____________________________________________   DIFFERENTIAL DIAGNOSIS   Viral illness, pneumonia, coronavirus, migraine, tension headache  FINAL ASSESSMENT AND PLAN  COVID-19   Plan: The patient had presented for viral symptoms. Patient's labs did reveal COVID-19 positivity. Patient's imaging was unremarkable.  Clinically she looks well.  I have encouraged vitamin D and zinc to take at home.  She will be discharged with Tussionex and advised to return for worsening symptoms.   Laurence Aly, MD    Note: This note was generated in part or whole with voice recognition software. Voice recognition is usually quite accurate but there are transcription errors that can and very often do occur. I apologize for any typographical errors that were not detected and corrected.     Earleen Newport,  MD 08/05/18 [phone]

## 2019-02-06 ENCOUNTER — Other Ambulatory Visit: Payer: Self-pay | Admitting: Family Medicine

## 2019-02-06 DIAGNOSIS — Z1231 Encounter for screening mammogram for malignant neoplasm of breast: Secondary | ICD-10-CM

## 2019-02-14 ENCOUNTER — Ambulatory Visit: Payer: BC Managed Care – PPO

## 2019-02-19 ENCOUNTER — Other Ambulatory Visit: Payer: Self-pay

## 2019-02-19 ENCOUNTER — Ambulatory Visit
Admission: RE | Admit: 2019-02-19 | Discharge: 2019-02-19 | Disposition: A | Discharge status: Home / Self Care | Payer: BC Managed Care – PPO | Source: Ambulatory Visit | Attending: Family Medicine | Admitting: Family Medicine

## 2019-02-19 DIAGNOSIS — Z1231 Encounter for screening mammogram for malignant neoplasm of breast: Secondary | ICD-10-CM | POA: Insufficient documentation

## 2019-02-20 ENCOUNTER — Other Ambulatory Visit: Payer: Self-pay | Admitting: Family Medicine

## 2019-02-20 DIAGNOSIS — R928 Other abnormal and inconclusive findings on diagnostic imaging of breast: Secondary | ICD-10-CM

## 2019-02-20 DIAGNOSIS — N6310 Unspecified lump in the right breast, unspecified quadrant: Secondary | ICD-10-CM

## 2019-03-06 ENCOUNTER — Ambulatory Visit
Admission: RE | Admit: 2019-03-06 | Discharge: 2019-03-06 | Disposition: A | Discharge status: Home / Self Care | Payer: BC Managed Care – PPO | Source: Ambulatory Visit | Attending: Family Medicine | Admitting: Family Medicine

## 2019-03-06 DIAGNOSIS — N6310 Unspecified lump in the right breast, unspecified quadrant: Secondary | ICD-10-CM

## 2019-03-06 DIAGNOSIS — R928 Other abnormal and inconclusive findings on diagnostic imaging of breast: Secondary | ICD-10-CM

## 2019-06-10 NOTE — Progress Notes (Signed)
06/11/19 12:47 PM   Peggy Cooper Aug 30, 1975 [id number]  Referring provider: Dione Housekeeper, MD [address] [place],  [place] [address]  Chief Complaint  Patient presents with  . possible bladder spasms    HPI: Peggy Cooper is a 44 y.o. F who presents today for the evaluation and management of bladder pain.   Visited PCP on 05/01/19 c/o constant pressure that radiates around to her lower back and also has concern for incomplete bladder emptying. No bladders scan during visit. UA and urine culture both negative.   Today, she reports of intermittent lower back pain that radiates around lower back to pelvis which lasts for 1-2 days. Pain improves with Ibuprofein, heating pad, and Biofreeze. She denies pain radiating to buttocks.   She reports of AM urinary frequency however attributes to hot tea and protein shake intake . Denies gross hematuria, dysuria, UTI, urgency.  She is not personally concerned about bladder emptying as an issue.  She reports of normal bowel movements.   PVR 1 mL. Creatinine stable.   PMH: Past Medical History:  Diagnosis Date  . GERD (gastroesophageal reflux disease)   . Scoliosis     Surgical History: Past Surgical History:  Procedure Laterality Date  . CESAREAN SECTION    . CHOLECYSTECTOMY N/A 02/12/2016   Procedure: LAPAROSCOPIC CHOLECYSTECTOMY WITH INTRAOPERATIVE CHOLANGIOGRAM;  Surgeon: Nadeen Landau, MD;  Location: [facility] ORS;  Service: General;  Laterality: N/A;  . DIAGNOSTIC LAPAROSCOPY    . ESOPHAGOGASTRODUODENOSCOPY      Home Medications:  Allergies as of 06/11/2019   No Known Allergies     Medication List       Accurate as of June 11, 2019 11:59 PM. If you have any questions, ask your nurse or doctor.        STOP taking these medications   chlorpheniramine-HYDROcodone 10-8 MG/5ML Suer Commonly known as: Tussionex Pennkinetic ER Stopped by: Vanna Scotland, MD   fluticasone 50 MCG/ACT nasal spray Commonly  known as: FLONASE Stopped by: Vanna Scotland, MD   HYDROcodone-acetaminophen 5-325 MG tablet Commonly known as: Norco Stopped by: Vanna Scotland, MD     TAKE these medications   ibuprofen 200 MG tablet Commonly known as: ADVIL Take 400 mg by mouth every 6 (six) hours as needed for headache (pain).   multivitamin with minerals Tabs tablet Take 1 tablet by mouth daily.   ranitidine 150 MG tablet Commonly known as: ZANTAC Take 150 mg by mouth as needed.       Allergies: No Known Allergies  Family History: Family History  Problem Relation Age of Onset  . Breast cancer Neg Hx     Social History:  reports that she has never smoked. She has never used smokeless tobacco. She reports current alcohol use. She reports that she does not use drugs.   Physical Exam: BP 139/84   Pulse 65   Ht 5\' 3"  (1.6 m)   Wt 147 lb (66.7 kg)   BMI 26.04 kg/m   Constitutional:  Alert and oriented, No acute distress. HEENT: [place] AT, moist mucus membranes.  Trachea midline, no masses. Cardiovascular: No clubbing, cyanosis, or edema. Respiratory: Normal respiratory effort, no increased work of breathing. Skin: No rashes, bruises or suspicious lesions. Neurologic: Grossly intact, no focal deficits, moving all 4 extremities. Psychiatric: Normal mood and affect.  Laboratory Data:  Urinalysis UA negative  Pertinent Imaging: Results for orders placed or performed in visit on 06/11/19  BLADDER SCAN AMB NON-IMAGING  Result Value  Ref Range   Scan Result 1 ML     Assessment & Plan:    1. Back pain  Adequate emptying of bladder- PVR minimal Relatively minimal to no urinary symptoms  UA/renal function unremarkable  Based on nature of pain and stretching, suspect MSK issue, advised to consult with PCP  No further evaluation needed unless she develops urinary symptoms related to back pain  F/u as needed  Return if symptoms worsen or fail to improve.  [facility] [facility] [address] [place], [place] [address] [phone]  I, Lucas Mallow, am acting as a scribe for Dr. Hollice Espy,  I have reviewed the above documentation for accuracy and completeness, and I agree with the above.   Hollice Espy, MD

## 2019-06-11 ENCOUNTER — Ambulatory Visit: Payer: BC Managed Care – PPO | Admitting: Urology

## 2019-06-11 ENCOUNTER — Other Ambulatory Visit: Payer: Self-pay

## 2019-06-11 VITALS — BP 139/84 | HR 65 | Ht 63.0 in | Wt 147.0 lb

## 2019-06-11 DIAGNOSIS — R300 Dysuria: Secondary | ICD-10-CM

## 2019-06-11 LAB — BLADDER SCAN AMB NON-IMAGING: Scan Result: 1

## 2020-08-24 ENCOUNTER — Other Ambulatory Visit: Payer: Self-pay | Admitting: Family Medicine

## 2020-08-24 DIAGNOSIS — Z1231 Encounter for screening mammogram for malignant neoplasm of breast: Secondary | ICD-10-CM

## 2020-09-15 ENCOUNTER — Ambulatory Visit
Admission: RE | Admit: 2020-09-15 | Discharge: 2020-09-15 | Disposition: A | Discharge status: Home / Self Care | Payer: BC Managed Care – PPO | Source: Ambulatory Visit | Attending: Family Medicine | Admitting: Family Medicine

## 2020-09-15 ENCOUNTER — Other Ambulatory Visit: Payer: Self-pay

## 2020-09-15 DIAGNOSIS — Z1231 Encounter for screening mammogram for malignant neoplasm of breast: Secondary | ICD-10-CM | POA: Diagnosis present

## 2021-06-24 DIAGNOSIS — Z13228 Encounter for screening for other metabolic disorders: Secondary | ICD-10-CM | POA: Insufficient documentation

## 2021-08-26 ENCOUNTER — Other Ambulatory Visit: Payer: Self-pay | Admitting: Family Medicine

## 2021-08-26 DIAGNOSIS — Z1231 Encounter for screening mammogram for malignant neoplasm of breast: Secondary | ICD-10-CM

## 2021-09-20 ENCOUNTER — Ambulatory Visit
Admission: RE | Admit: 2021-09-20 | Discharge: 2021-09-20 | Disposition: A | Discharge status: Home / Self Care | Payer: BC Managed Care – PPO | Source: Ambulatory Visit | Attending: Family Medicine | Admitting: Family Medicine

## 2021-09-20 DIAGNOSIS — Z1231 Encounter for screening mammogram for malignant neoplasm of breast: Secondary | ICD-10-CM | POA: Insufficient documentation

## 2022-04-20 ENCOUNTER — Ambulatory Visit (INDEPENDENT_AMBULATORY_CARE_PROVIDER_SITE_OTHER): Payer: No Typology Code available for payment source | Admitting: Podiatry

## 2022-04-20 ENCOUNTER — Other Ambulatory Visit: Payer: Self-pay | Admitting: Podiatry

## 2022-04-20 ENCOUNTER — Encounter: Payer: Self-pay | Admitting: Podiatry

## 2022-04-20 ENCOUNTER — Ambulatory Visit (INDEPENDENT_AMBULATORY_CARE_PROVIDER_SITE_OTHER): Payer: No Typology Code available for payment source

## 2022-04-20 DIAGNOSIS — Q6672 Congenital pes cavus, left foot: Secondary | ICD-10-CM

## 2022-04-20 DIAGNOSIS — Q6671 Congenital pes cavus, right foot: Secondary | ICD-10-CM | POA: Diagnosis not present

## 2022-04-20 DIAGNOSIS — M778 Other enthesopathies, not elsewhere classified: Secondary | ICD-10-CM

## 2022-04-20 DIAGNOSIS — M722 Plantar fascial fibromatosis: Secondary | ICD-10-CM

## 2022-04-20 MED ORDER — METHYLPREDNISOLONE 4 MG PO TBPK: ORAL_TABLET | ORAL | 0 refills | Status: AC

## 2022-04-20 MED ORDER — MELOXICAM 15 MG PO TABS: 15.0000 mg | ORAL_TABLET | Freq: Every day | ORAL | 3 refills | Status: AC

## 2022-04-20 MED ORDER — TRIAMCINOLONE ACETONIDE 40 MG/ML IJ SUSP
20.0000 mg | Freq: Once | INTRAMUSCULAR | Status: AC
  Administered 2022-04-20: 20 mg

## 2022-04-20 NOTE — Progress Notes (Signed)
  Subjective:  Patient ID: Peggy Cooper, female    DOB: February 06, 1976,  MRN: [id number] HPI Chief Complaint  Patient presents with   Foot Pain    1st and 5th MPJ bilateral - aching x several months, has high arches, burning, tingling in 2nd toe right, tried OTC arch supports, husband says she walks with her knees turned in   New Patient (Initial Visit)    47 y.o. female presents with the above complaint.   ROS: Denies fever chills nausea vomit muscle aches pains calf pain back pain chest pain shortness of breath.  Past Medical History:  Diagnosis Date   GERD (gastroesophageal reflux disease)    Scoliosis    Past Surgical History:  Procedure Laterality Date   CESAREAN SECTION     CHOLECYSTECTOMY N/A 02/12/2016   Procedure: LAPAROSCOPIC CHOLECYSTECTOMY WITH INTRAOPERATIVE CHOLANGIOGRAM;  Surgeon: Leonie Green, MD;  Location: [facility] ORS;  Service: General;  Laterality: N/A;   DIAGNOSTIC LAPAROSCOPY     ESOPHAGOGASTRODUODENOSCOPY      Current Outpatient Medications:    ibuprofen (ADVIL,MOTRIN) 200 MG tablet, Take 400 mg by mouth every 6 (six) hours as needed for headache (pain)., Disp: , Rfl:    Multiple Vitamin (MULTIVITAMIN WITH MINERALS) TABS tablet, Take 1 tablet by mouth daily., Disp: , Rfl:   No Known Allergies Review of Systems Objective:  There were no vitals filed for this visit.  General: Well developed, nourished, in no acute distress, alert and oriented x3   Dermatological: Skin is warm, dry and supple bilateral. Nails x 10 are well maintained; remaining integument appears unremarkable at this time. There are no open sores, no preulcerative lesions, no rash or signs of infection present.  Vascular: Dorsalis Pedis artery and Posterior Tibial artery pedal pulses are 2/4 bilateral with immedate capillary fill time. Pedal hair growth present. No varicosities and no lower extremity edema present bilateral.   Neruologic: Grossly intact via light touch bilateral.  Vibratory intact via tuning fork bilateral. Protective threshold with Semmes Wienstein monofilament intact to all pedal sites bilateral. Patellar and Achilles deep tendon reflexes 2+ bilateral. No Babinski or clonus noted bilateral.   Musculoskeletal: No gross boney pedal deformities bilateral. No pain, crepitus, or limitation noted with foot and ankle range of motion bilateral. Muscular strength 5/5 in all groups tested bilateral.  Rigid cavus foot deformity with normal range of motion of the ankle joint and does not demonstrate a tight Achilles at all.  She does have mild callusing first and fifth metatarsal plantarly.  She does have pain on palpation medial calcaneal tubercles bilateral heels no pain on medial lateral compression of the calcaneus.  She states that the heel pain is worse than the forefoot pain but she did know it was present.  Gait: Unassisted, Nonantalgic.    Radiographs:  Radiographs taken today demonstrate cavus foot deformity bilateral.  No significant osseous abnormalities otherwise she does have soft tissue increase in density plantar fascial Caney insertion sites bilateral.  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  Right greater than left.  Plan: Starting her on methylprednisolone injected the bilateral heels today 20 mg Kenalog 5 mg Marcaine point of maximal tenderness.  I like to follow-up with her in about 6 weeks to make sure she is improving.  Otherwise we will cast her for orthotics at that time.     Yasheka Fossett T. [place], [place]

## 2022-05-09 ENCOUNTER — Ambulatory Visit: Payer: No Typology Code available for payment source

## 2022-05-09 DIAGNOSIS — K640 First degree hemorrhoids: Secondary | ICD-10-CM | POA: Diagnosis not present

## 2022-05-09 DIAGNOSIS — Z83719 Family history of colon polyps, unspecified: Secondary | ICD-10-CM | POA: Diagnosis not present

## 2022-05-09 DIAGNOSIS — Z1211 Encounter for screening for malignant neoplasm of colon: Secondary | ICD-10-CM | POA: Diagnosis present

## 2022-06-08 ENCOUNTER — Ambulatory Visit (INDEPENDENT_AMBULATORY_CARE_PROVIDER_SITE_OTHER): Payer: No Typology Code available for payment source | Admitting: Podiatry

## 2022-06-08 DIAGNOSIS — M778 Other enthesopathies, not elsewhere classified: Secondary | ICD-10-CM | POA: Diagnosis not present

## 2022-06-08 MED ORDER — TRIAMCINOLONE ACETONIDE 40 MG/ML IJ SUSP
40.0000 mg | Freq: Once | INTRAMUSCULAR | Status: AC
  Administered 2022-06-08: 40 mg

## 2022-06-08 NOTE — Progress Notes (Signed)
She presents today states that her heel is doing really well she still having pain across the forefoot right seems to be worse than the left.  States the majority of the pain is right and here she points to the second and third metatarsophalangeal joints bilaterally.  Objective: Vital signs stable alert and oriented x 3 she has pain on end range of motion and on palpation of the second and third metatarsophalangeal joint right greater than left.  She has a cavus foot deformity.  Assessment: Capsulitis forefoot right well-healing Planter fasciitis right.  Plan: At this point we injected around the second metatarsal phalangeal joint and third metatarsophalangeal joint with 10 mg Kenalog 5 mg Marcaine.  This was performed bilaterally started her on meloxicam discussed appropriate shoes will follow-up with her in 1 month and we will consider orthotics if necessary.

## 2022-07-20 ENCOUNTER — Ambulatory Visit: Payer: No Typology Code available for payment source | Admitting: Podiatry

## 2022-07-21 ENCOUNTER — Other Ambulatory Visit: Payer: Self-pay | Admitting: Family Medicine

## 2022-07-21 DIAGNOSIS — Z1231 Encounter for screening mammogram for malignant neoplasm of breast: Secondary | ICD-10-CM

## 2022-09-27 ENCOUNTER — Ambulatory Visit: Payer: BC Managed Care – PPO

## 2023-02-25 IMAGING — MG MM DIGITAL SCREENING BILAT W/ TOMO AND CAD
8 series · 8 of 24 positions shown · non-contrast
Comparison: Previous exam(s).

CLINICAL DATA: Screening.

EXAM:
DIGITAL SCREENING BILATERAL MAMMOGRAM WITH TOMOSYNTHESIS AND CAD
TECHNIQUE: Bilateral screening digital craniocaudal and mediolateral oblique
mammograms were obtained. Bilateral screening digital breast
tomosynthesis was performed. The images were evaluated with
computer-aided detection.

[R CC synth-2D]
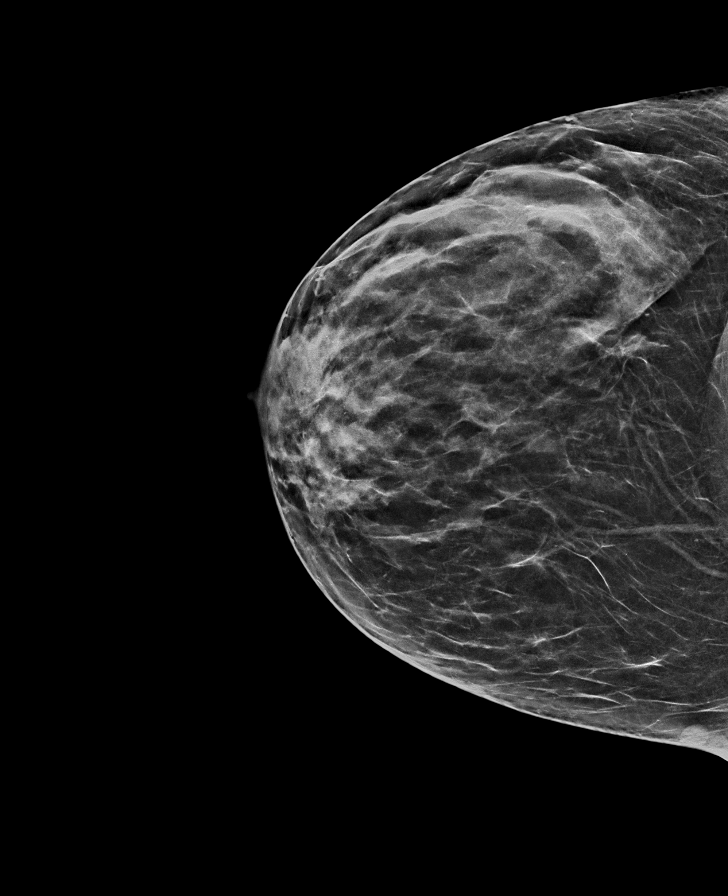

[R MLO synth-2D]
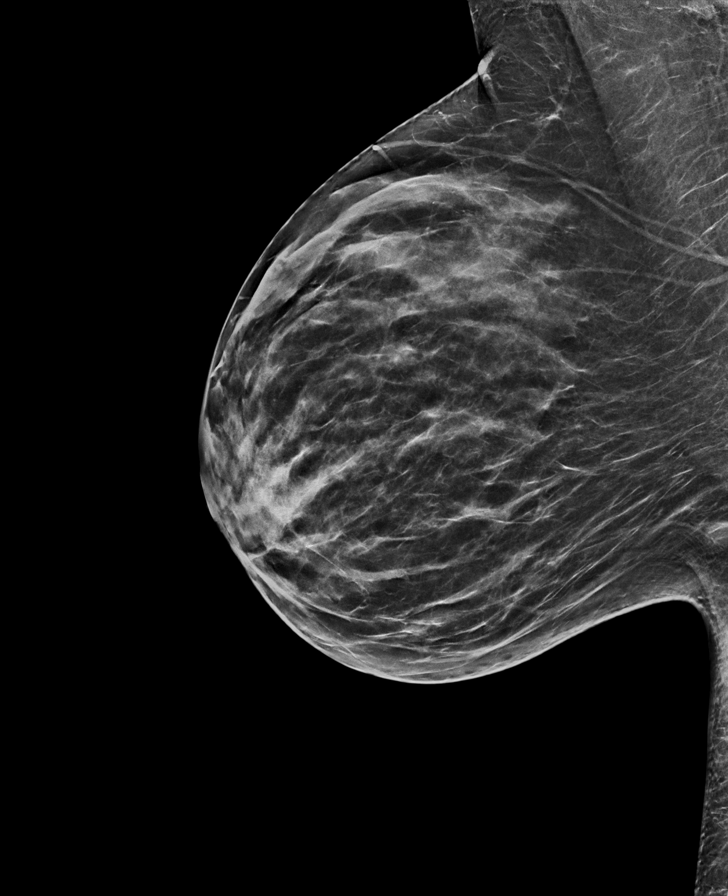

[L MLO synth-2D]
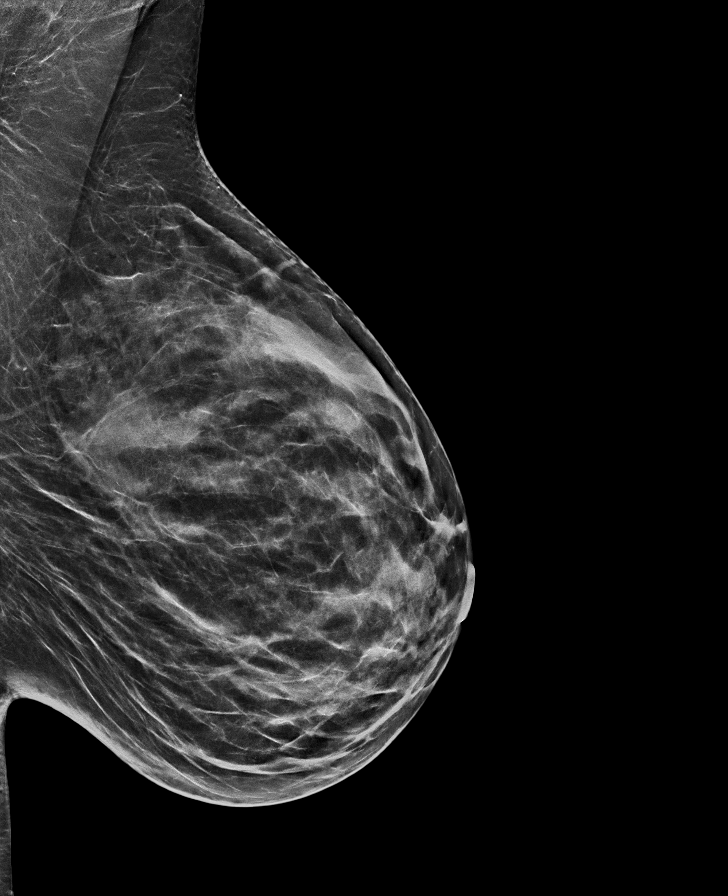

[L CC synth-2D]
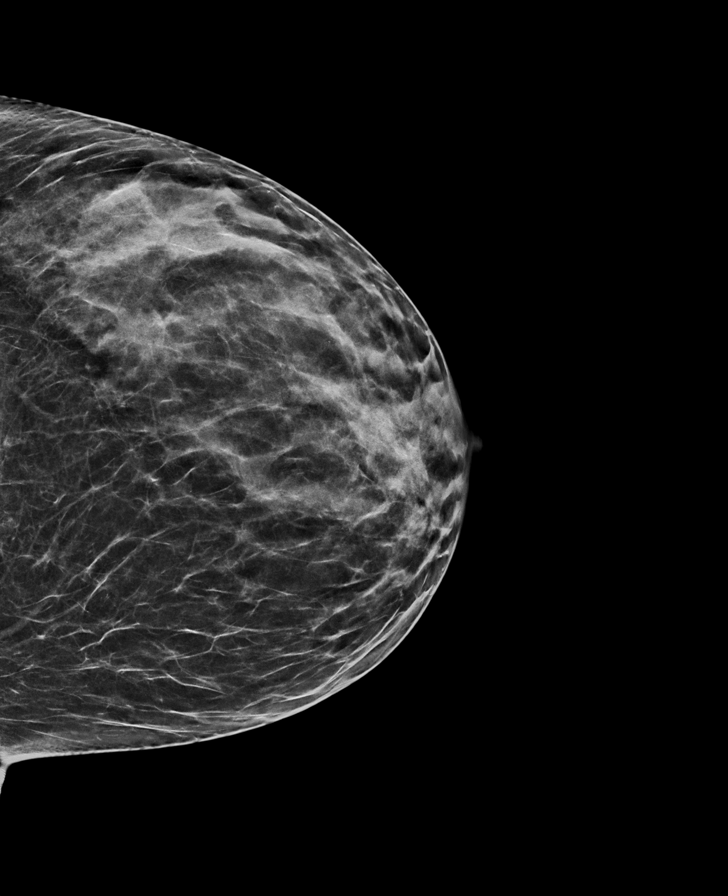

[L CC tomo · tomo slice 31/61.0]
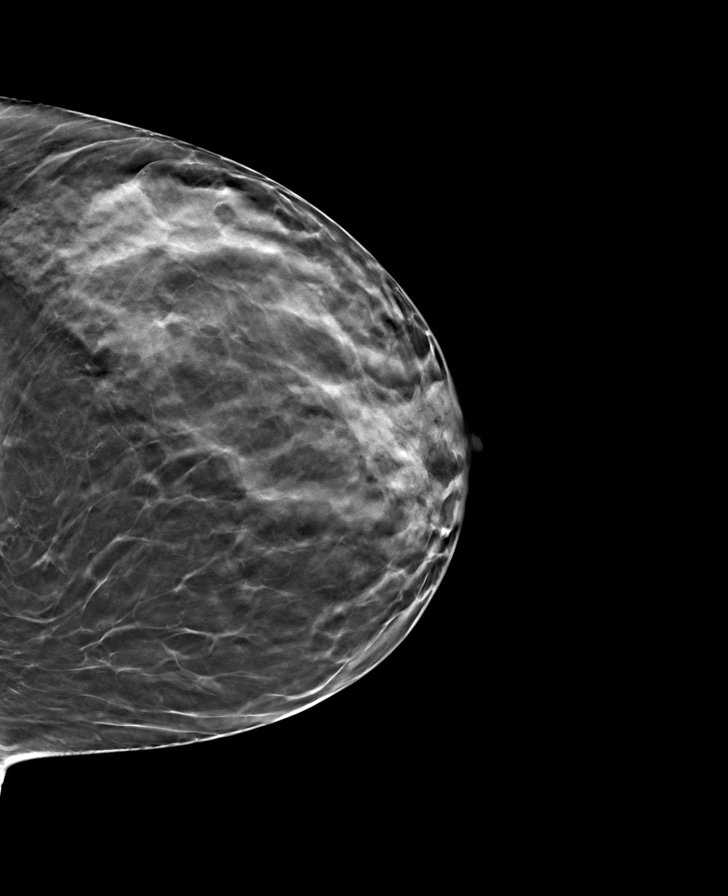

[R CC tomo · tomo slice 31/61.0]
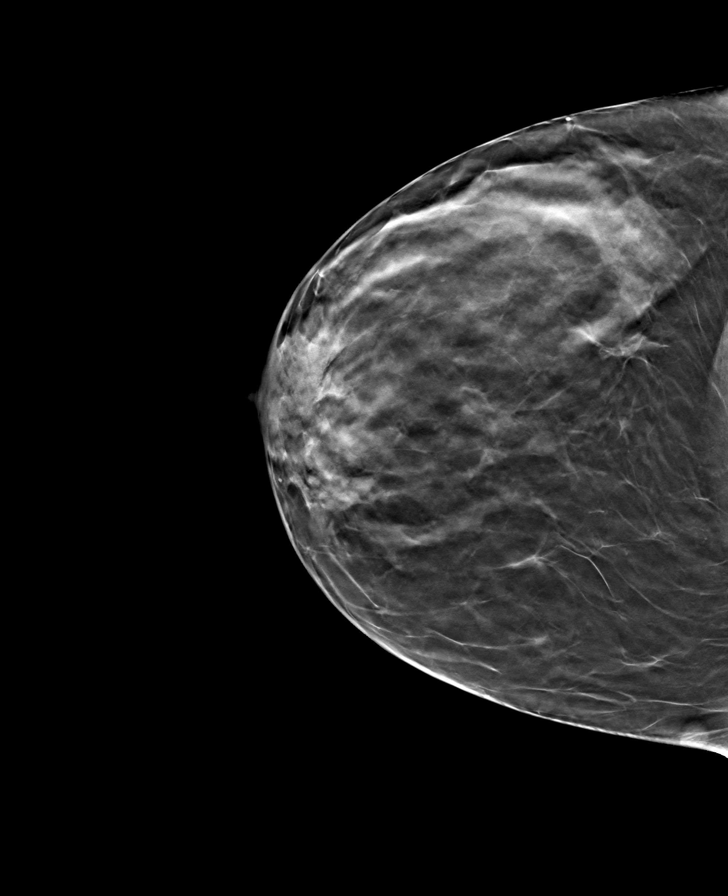

[R MLO tomo · tomo slice 32/63.0]
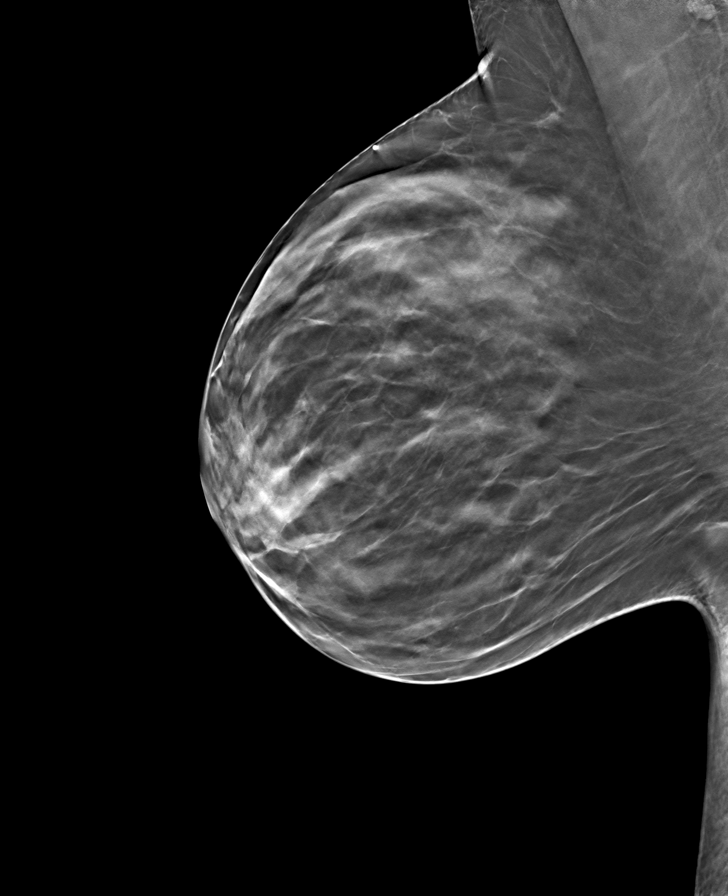

[L MLO tomo · tomo slice 33/66.0]
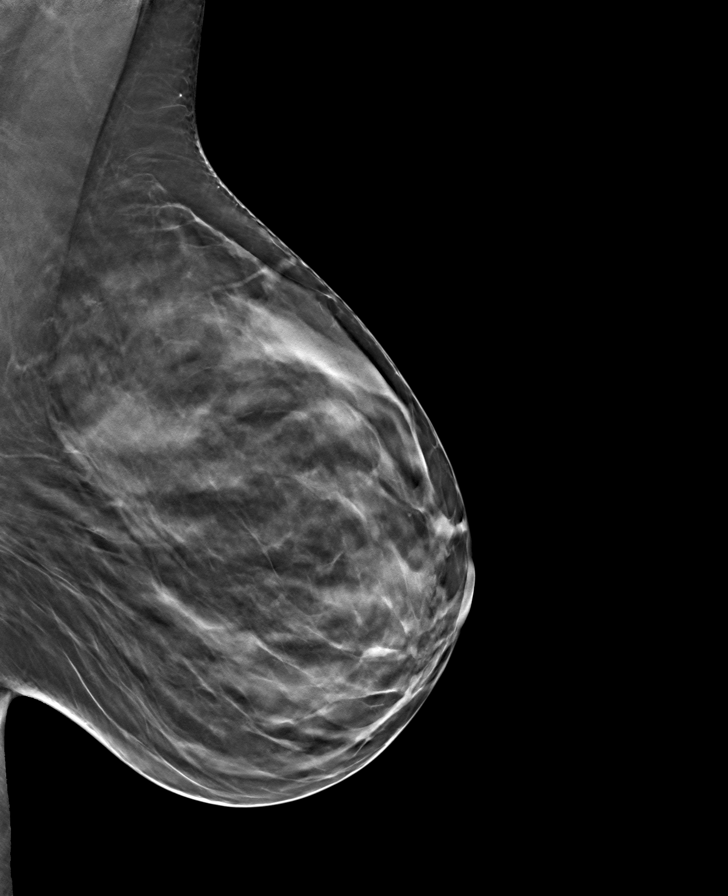

[8 of 24 positions shown; findings below may reference images not displayed]

ACR Breast Density Category c: The breast tissue is heterogeneously
dense, which may obscure small masses.
FINDINGS: There are no findings suspicious for malignancy.
IMPRESSION: No mammographic evidence of malignancy. A result letter of this
screening mammogram will be mailed directly to the patient.

RECOMMENDATION:
Screening mammogram in one year. (Code:Q3-W-BC3)

BI-RADS CATEGORY  1: Negative.

## 2023-04-04 ENCOUNTER — Ambulatory Visit
Admission: RE | Admit: 2023-04-04 | Discharge: 2023-04-04 | Disposition: A | Discharge status: Home / Self Care | Payer: BC Managed Care – PPO | Source: Ambulatory Visit | Attending: Family Medicine | Admitting: Family Medicine

## 2023-04-04 DIAGNOSIS — Z1231 Encounter for screening mammogram for malignant neoplasm of breast: Secondary | ICD-10-CM | POA: Insufficient documentation
